# Patient Record
Sex: Female | Born: 1955 | Race: White | Hispanic: No | Marital: Married | Smoking: Former smoker
Health system: Southern US, Community
[De-identification: ages and names within clinical notes are randomized; demographics above are authoritative.]

## PROBLEM LIST (undated history)

## (undated) HISTORY — PX: NECK SURGERY: SHX720

---

## 2011-04-16 ENCOUNTER — Ambulatory Visit: Payer: Self-pay

## 2013-12-20 ENCOUNTER — Emergency Department: Payer: Self-pay | Admitting: Emergency Medicine

## 2014-05-12 ENCOUNTER — Emergency Department: Payer: Self-pay | Admitting: Student
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
## 2018-07-16 LAB — GENERIC EXTERNAL RESULT (UNMAPPED): Hemoglobin A1C: 6 % — ABNORMAL HIGH (ref 4.0–5.6)

## 2018-10-27 ENCOUNTER — Encounter: Payer: Self-pay | Admitting: Emergency Medicine

## 2018-10-27 ENCOUNTER — Other Ambulatory Visit: Payer: Self-pay

## 2018-10-27 ENCOUNTER — Ambulatory Visit
Admission: EM | Admit: 2018-10-27 | Discharge: 2018-10-27 | Disposition: A | Discharge status: Home / Self Care | Payer: Managed Care, Other (non HMO) | Attending: Urgent Care | Admitting: Urgent Care

## 2018-10-27 DIAGNOSIS — M25552 Pain in left hip: Secondary | ICD-10-CM | POA: Diagnosis not present

## 2018-10-27 DIAGNOSIS — M5432 Sciatica, left side: Secondary | ICD-10-CM

## 2018-10-27 MED ORDER — METHYLPREDNISOLONE 4 MG PO TBPK: ORAL_TABLET | ORAL | 0 refills | Status: AC

## 2018-10-27 MED ORDER — IBUPROFEN 800 MG PO TABS: 800.0000 mg | ORAL_TABLET | Freq: Three times a day (TID) | ORAL | 0 refills | Status: AC

## 2018-10-27 NOTE — Discharge Instructions (Addendum)
It was very nice meeting you today in clinic. Thank you for entrusting me with your care.   Please utilize the medications that we discussed. Your prescriptions have been called in to your pharmacy. Moist heat will help with your pain as well. Rest to the extent possible and allow the medications to take effect.   Make arrangements to follow up with your regular doctor in 1 week for re-evaluation. If your symptoms/condition worsens, please seek follow up care either here or in the ER. Please remember, our [facility] providers are "right here with you" when you need [place].   Again, it was my pleasure to take care of you today. Thank you for choosing our clinic. I hope that you start to feel better quickly.   Quentin Mulling, MSN, APRN, FNP-C, CEN Advanced Practice Provider [facility]

## 2018-10-27 NOTE — ED Provider Notes (Signed)
[address] [place], [place] [address] [phone]   Name: Valerie Gonzalez DOB: [birth year] MRN: [id number] CSN: [id number] PCP: Patient, No Pcp Per  Arrival date and time:  10/27/18 0802  Chief Complaint:  Hip Pain (left)  NOTE: Prior to seeing the patient today, I have reviewed the triage nursing documentation and vital signs. Clinical staff has updated patient's PMH/PSHx, current medication list, and drug allergies/intolerances to ensure comprehensive history available to assist in medical decision making.   History:   HPI: Valerie Gonzalez is a 63 y.o. female who presents today with complaints of LEFT hip pain for the last 3 to 3-1/2 weeks.  Pain precipitated by increased bending at work.  PMH (+) for sciatica, however patient has not had issues in quite some time.  She advises that pain radiates from her hip down into her foot.  Patient denies any extremity weakness or issues with bowel or bladder incontinence.  Patient denies past medical history significant for disc problems in her lower back.  Patient has been using a heating pad and ice, which has been minimally effective.  Additionally, patient using ibuprofen to help with her pain several times a day.  Pain affecting her sleep quality; patient having to sleep in a recliner at night.  History reviewed. No pertinent past medical history.  Past Surgical History:  Procedure Laterality Date  . NECK SURGERY      Family History  Problem Relation Age of Onset  . Cancer Father     Social History   Socioeconomic History  . Marital status: Married    Spouse name: Not on file  . Number of children: Not on file  . Years of education: Not on file  . Highest education level: Not on file  Occupational History  . Not on file  Social Needs  . Financial resource strain: Not on file  . Food insecurity:    Worry: Not on file    Inability: Not on file  . Transportation needs:    Medical: Not on file    Non-medical:  Not on file  Tobacco Use  . Smoking status: Former Games developer  . Smokeless tobacco: Never Used  Substance and Sexual Activity  . Alcohol use: Not Currently  . Drug use: Never  . Sexual activity: Not on file  Lifestyle  . Physical activity:    Days per week: Not on file    Minutes per session: Not on file  . Stress: Not on file  Relationships  . Social connections:    Talks on phone: Not on file    Gets together: Not on file    Attends religious service: Not on file    Active member of club or organization: Not on file    Attends meetings of clubs or organizations: Not on file    Relationship status: Not on file  . Intimate partner violence:    Fear of current or ex partner: Not on file    Emotionally abused: Not on file    Physically abused: Not on file    Forced sexual activity: Not on file  Other Topics Concern  . Not on file  Social History Narrative  . Not on file    There are no active problems to display for this patient.   Home Medications:    No outpatient medications have been marked as taking for the 10/27/18 encounter [facility] Encounter).    Allergies:   Patient has no known allergies.  Review of Systems (  ROS): Review of Systems  Constitutional: Negative for chills and fever.  Respiratory: Negative for cough and shortness of breath.   Cardiovascular: Negative for chest pain and palpitations.  Musculoskeletal: Positive for back pain.  Neurological: Positive for numbness. Negative for weakness.     Physical Exam:  Triage Vital Signs ED Triage Vitals  Enc Vitals Group     BP 10/27/18 0818 133/90     Pulse Rate 10/27/18 0818 73     Resp 10/27/18 0818 14     Temp 10/27/18 0818 97.7 F (36.5 C)     Temp Source 10/27/18 0818 Oral     SpO2 10/27/18 0818 100 %     Weight 10/27/18 0813 140 lb (63.5 kg)     Height 10/27/18 0813 5' (1.524 m)     Head Circumference --      Peak Flow --      Pain Score 10/27/18 0813 2     Pain Loc --      Pain Edu? --       Excl. in GC? --      Physical Exam  Constitutional: She is oriented to person, place, and time and well-developed, well-nourished, and in no distress.  HENT:  Head: Normocephalic and atraumatic.  Mouth/Throat: Mucous membranes are normal.  Eyes: Pupils are equal, round, and reactive to light. EOM are normal.  Neck: Neck supple.  Cardiovascular: Normal rate, regular rhythm, normal heart sounds and intact distal pulses. Exam reveals no gallop and no friction rub.  No murmur heard. Pulmonary/Chest: Effort normal and breath sounds normal. No respiratory distress. She has no wheezes. She has no rales.  Musculoskeletal:     Left hip: She exhibits tenderness. She exhibits no swelling and no deformity.     Comments: Radicular pain extending from LEFT lateral hip down the posterior aspect of her lower extremity into her foot. (+) numbness and tingling. PMS intact.   Neurological: She is alert and oriented to person, place, and time.  Skin: Skin is warm and dry. No rash noted. No erythema.  Psychiatric: Mood, affect and judgment normal.  Nursing note and vitals reviewed.   Urgent Care Treatments / Results:   LABS: PLEASE NOTE: all labs that were ordered this encounter are listed, however only abnormal results are displayed. Labs Reviewed - No data to display  EKG: -None  RADIOLOGY: No results found.  PRODEDURES: Procedures  MEDICATIONS RECEIVED THIS VISIT: Medications - No data to display  PERTINENT CLINICAL COURSE NOTES/UPDATES:   Initial Impression / Assessment and Plan / Urgent Care Course:    Valerie Gonzalez is a 63 y.o. female who [facility] to [facility] today with complaints of Hip Pain (left)  Pertinent labs & imaging results that were available during my care of the patient were personally reviewed by me and considered in my medical decision making (see lab/imaging section of note for values and interpretations).  Exam reveals minimal tenderness over LEFT hip.   Patient complains of numbness and tingling extending from her hip down into her foot.  PMH (+) for sciatica.  Discussed that presenting symptoms consistent with radiculopathy related to her sciatic nerve.  Will treat with anti-inflammatory (ibuprofen) and systemic steroids (Medrol).  Patient encouraged to utilize moist heat to help with her pain.  Additionally, she was encouraged to rest to allow medications time to be effective.  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  Patient advised to return call to the clinic should she need documentation to be out of work for worsening symptoms.  Discussed follow up with primary care physician in 1 week for re-evaluation. I have reviewed the follow up and strict return precautions for any new or worsening symptoms. Patient is aware of symptoms that would be deemed urgent/emergent, and would thus require further evaluation either here or in the emergency department. At the time of discharge, she verbalized understanding and consent with the discharge plan as it was reviewed with her. All questions were fielded by provider and/or clinic staff prior to patient discharge.    Final Clinical Impressions(s) / Urgent Care Diagnoses:   Final diagnoses:  Sciatica of left side without back pain  Pain of left hip joint    New Prescriptions:   Meds ordered this encounter  Medications  . ibuprofen (ADVIL) 800 MG tablet    Sig: Take 1 tablet (800 mg total) by mouth 3 (three) times daily.    Dispense:  21 tablet    Refill:  0  . methylPREDNISolone (MEDROL DOSEPAK) 4 MG TBPK tablet    Sig: Taper per package instructions.    Dispense:  21 tablet    Refill:  0    Controlled Substance Prescriptions:  [place] Controlled Substance Registry consulted? Not Applicable  NOTE: This note was prepared using Dragon dictation software along with smaller  phrase technology. Despite my best ability to proofread, there is the potential that transcriptional errors may still occur from this process, and are completely unintentional.     Verlee Monte, NP 10/27/18 [phone]

## 2018-10-27 NOTE — ED Triage Notes (Signed)
Patient c/o left hip pain that started about 3 weeks ago.  Patient also reports that her pain goes down her left leg.

## 2018-11-02 ENCOUNTER — Other Ambulatory Visit: Payer: Self-pay

## 2018-11-02 ENCOUNTER — Ambulatory Visit
Admission: EM | Admit: 2018-11-02 | Discharge: 2018-11-02 | Disposition: A | Discharge status: Home / Self Care | Payer: Managed Care, Other (non HMO) | Attending: Family Medicine | Admitting: Family Medicine

## 2018-11-02 ENCOUNTER — Encounter: Payer: Self-pay | Admitting: Emergency Medicine

## 2018-11-02 ENCOUNTER — Ambulatory Visit (INDEPENDENT_AMBULATORY_CARE_PROVIDER_SITE_OTHER): Payer: Managed Care, Other (non HMO)

## 2018-11-02 DIAGNOSIS — M25552 Pain in left hip: Secondary | ICD-10-CM | POA: Diagnosis not present

## 2018-11-02 DIAGNOSIS — M25559 Pain in unspecified hip: Secondary | ICD-10-CM

## 2018-11-02 DIAGNOSIS — M5432 Sciatica, left side: Secondary | ICD-10-CM | POA: Diagnosis not present

## 2018-11-02 MED ORDER — CYCLOBENZAPRINE HCL 10 MG PO TABS: 10.0000 mg | ORAL_TABLET | Freq: Every day | ORAL | 0 refills | Status: AC

## 2018-11-02 MED ORDER — PREDNISONE 20 MG PO TABS: ORAL_TABLET | ORAL | 0 refills | Status: AC

## 2018-11-02 MED ORDER — HYDROCODONE-ACETAMINOPHEN 5-325 MG PO TABS: ORAL_TABLET | ORAL | 0 refills | Status: AC

## 2018-11-02 NOTE — ED Triage Notes (Signed)
Patient was seen here on 6/2 for left hip pain.  Patient c/o ongoing pain in her left hip and lower back.  Patient states that her medicines have not helped.

## 2018-11-02 NOTE — ED Provider Notes (Signed)
[facility]    CSN: [id number] Arrival date & time: 11/02/18  1141     History   Chief Complaint Chief Complaint  Patient presents with   Back Pain    HPI Valerie Gonzalez is a 63 y.o. female.   63 yo female with approximately 2 weeks h/o left hip pain as well as low back pain radiating down the back of her leg. Denies any falls or injuries, however states has had hip problems in the past. Was seen here last week and given medrol dose pak which patient states only helped minimally. States pain is worse when walking up stairs. Denies any fevers, chills.    Back Pain    History reviewed. No pertinent past medical history.  There are no active problems to display for this patient.   Past Surgical History:  Procedure Laterality Date   NECK SURGERY      OB History   No obstetric history on file.      Home Medications    Prior to Admission medications   Medication Sig Start Date End Date Taking? Authorizing Provider  ibuprofen (ADVIL) 800 MG tablet Take 1 tablet (800 mg total) by mouth 3 (three) times daily. 10/27/18  Yes Verlee MonteGray, Bryan E, NP  cyclobenzaprine (FLEXERIL) 10 MG tablet Take 1 tablet (10 mg total) by mouth at bedtime. 11/02/18   Payton Mccallumonty, Zamir Staples, MD  HYDROcodone-acetaminophen (NORCO/VICODIN) 5-325 MG tablet 1-2 tabs po q 8 hours prn 11/02/18   Payton Mccallumonty, Meighan Treto, MD  methylPREDNISolone (MEDROL DOSEPAK) 4 MG TBPK tablet Taper per package instructions. 10/27/18   Verlee MonteGray, Bryan E, NP  predniSONE (DELTASONE) 20 MG tablet 3 tabs po once day 1, then 2 tabs po qd x 3 days, then 1 tab po qd x 3 days, then half a tab po qd x 3 days 11/02/18   Payton Mccallumonty, Quinita Kostelecky, MD    Family History Family History  Problem Relation Age of Onset   Cancer Father     Social History Social History   Tobacco Use   Smoking status: Former Smoker   Smokeless tobacco: Never Used  Substance Use Topics   Alcohol use: Not Currently   Drug use: Never     Allergies   Patient has no  known allergies.   Review of Systems Review of Systems  Musculoskeletal: Positive for back pain.     Physical Exam Triage Vital Signs ED Triage Vitals  Enc Vitals Group     BP 11/02/18 1153 113/71     Pulse Rate 11/02/18 1153 79     Resp 11/02/18 1153 16     Temp 11/02/18 1153 98 F (36.7 C)     Temp Source 11/02/18 1153 Oral     SpO2 11/02/18 1153 98 %     Weight 11/02/18 1151 140 lb (63.5 kg)     Height 11/02/18 1151 5' (1.524 m)     Head Circumference --      Peak Flow --      Pain Score 11/02/18 1151 10     Pain Loc --      Pain Edu? --      Excl. in GC? --    No data found.  Updated Vital Signs BP 113/71 (BP Location: Left Arm)    Pulse 79    Temp 98 F (36.7 C) (Oral)    Resp 16    Ht 5' (1.524 m)    Wt 63.5 kg    SpO2 98%    BMI  27.34 kg/m   Visual Acuity Right Eye Distance:   Left Eye Distance:   Bilateral Distance:    Right Eye Near:   Left Eye Near:    Bilateral Near:     Physical Exam Vitals signs and nursing note reviewed.  Constitutional:      General: She is not in acute distress.    Appearance: She is well-developed. She is not diaphoretic.  Musculoskeletal:        General: Tenderness present.     Left hip: She exhibits tenderness (lateral) and bony tenderness. She exhibits normal range of motion, normal strength, no swelling, no crepitus, no deformity and no laceration.     Lumbar back: She exhibits tenderness (over the left buttock along the sciatic nerve) and spasm. She exhibits normal range of motion, no bony tenderness, no swelling, no edema, no deformity, no laceration, no pain and normal pulse.  Skin:    General: Skin is warm and dry.     Findings: No erythema or rash.  Neurological:     General: No focal deficit present.     Mental Status: She is alert.     Motor: No abnormal muscle tone.     Deep Tendon Reflexes: Reflexes are normal and symmetric. Reflexes normal.      UC Treatments / Results  Labs (all labs ordered are  listed, but only abnormal results are displayed) Labs Reviewed - No data to display  EKG None  Radiology Dg Hip Unilat With Pelvis 2-3 Views Left  Result Date: 11/02/2018 CLINICAL DATA:  Hip pain for 15 years EXAM: DG HIP (WITH OR WITHOUT PELVIS) 2-3V LEFT COMPARISON:  None. FINDINGS: There is no evidence of hip fracture or dislocation. There is no evidence of arthropathy or other focal bone abnormality. IMPRESSION: No acute osseous injury of the left hip. Electronically Signed   By: Kathreen Devoid   On: 11/02/2018 12:43    Procedures Procedures (including critical care time)  Medications Ordered in UC Medications - No data to display  Initial Impression / Assessment and Plan / UC Course  I have reviewed the triage vital signs and the nursing notes.  Pertinent labs & imaging results that were available during my care of the patient were reviewed by me and considered in my medical decision making (see chart for details).      Final Clinical Impressions(s) / UC Diagnoses   Final diagnoses:  Hip pain  Sciatica of left side    ED Prescriptions    Medication Sig Dispense Auth. Provider   predniSONE (DELTASONE) 20 MG tablet 3 tabs po once day 1, then 2 tabs po qd x 3 days, then 1 tab po qd x 3 days, then half a tab po qd x 3 days 14 tablet Lamyra Malcolm, MD   cyclobenzaprine (FLEXERIL) 10 MG tablet Take 1 tablet (10 mg total) by mouth at bedtime. 30 tablet Norval Gable, MD   HYDROcodone-acetaminophen (NORCO/VICODIN) 5-325 MG tablet 1-2 tabs po q 8 hours prn 6 tablet [place] Festa, MD      1. x-ray results and diagnosis reviewed with patient 2. rx as per orders above; reviewed possible side effects, interactions, risks and benefits  3. Recommend supportive treatment with rest, ice/heat 4. Follow-up prn if symptoms worsen or don't improve   Controlled Substance Prescriptions [place] Controlled Substance Registry consulted? Not Applicable   Norval Gable, MD 11/02/18 1308

## 2019-03-31 LAB — GENERIC EXTERNAL RESULT (UNMAPPED): Hemoglobin A1C: 5.6 % (ref 4.0–5.6)

## 2019-07-20 LAB — GENERIC EXTERNAL RESULT (UNMAPPED): Hemoglobin A1C: 5.6 % (ref 4.0–5.6)

## 2021-01-29 IMAGING — CR DG HIP (WITH OR WITHOUT PELVIS) 2-3V LEFT
3 series · 3 of 3 positions shown · non-contrast
Comparison: None.

CLINICAL DATA: Hip pain for 15 years

EXAM:
DG HIP (WITH OR WITHOUT PELVIS) 2-3V LEFT

[pelvis ap]
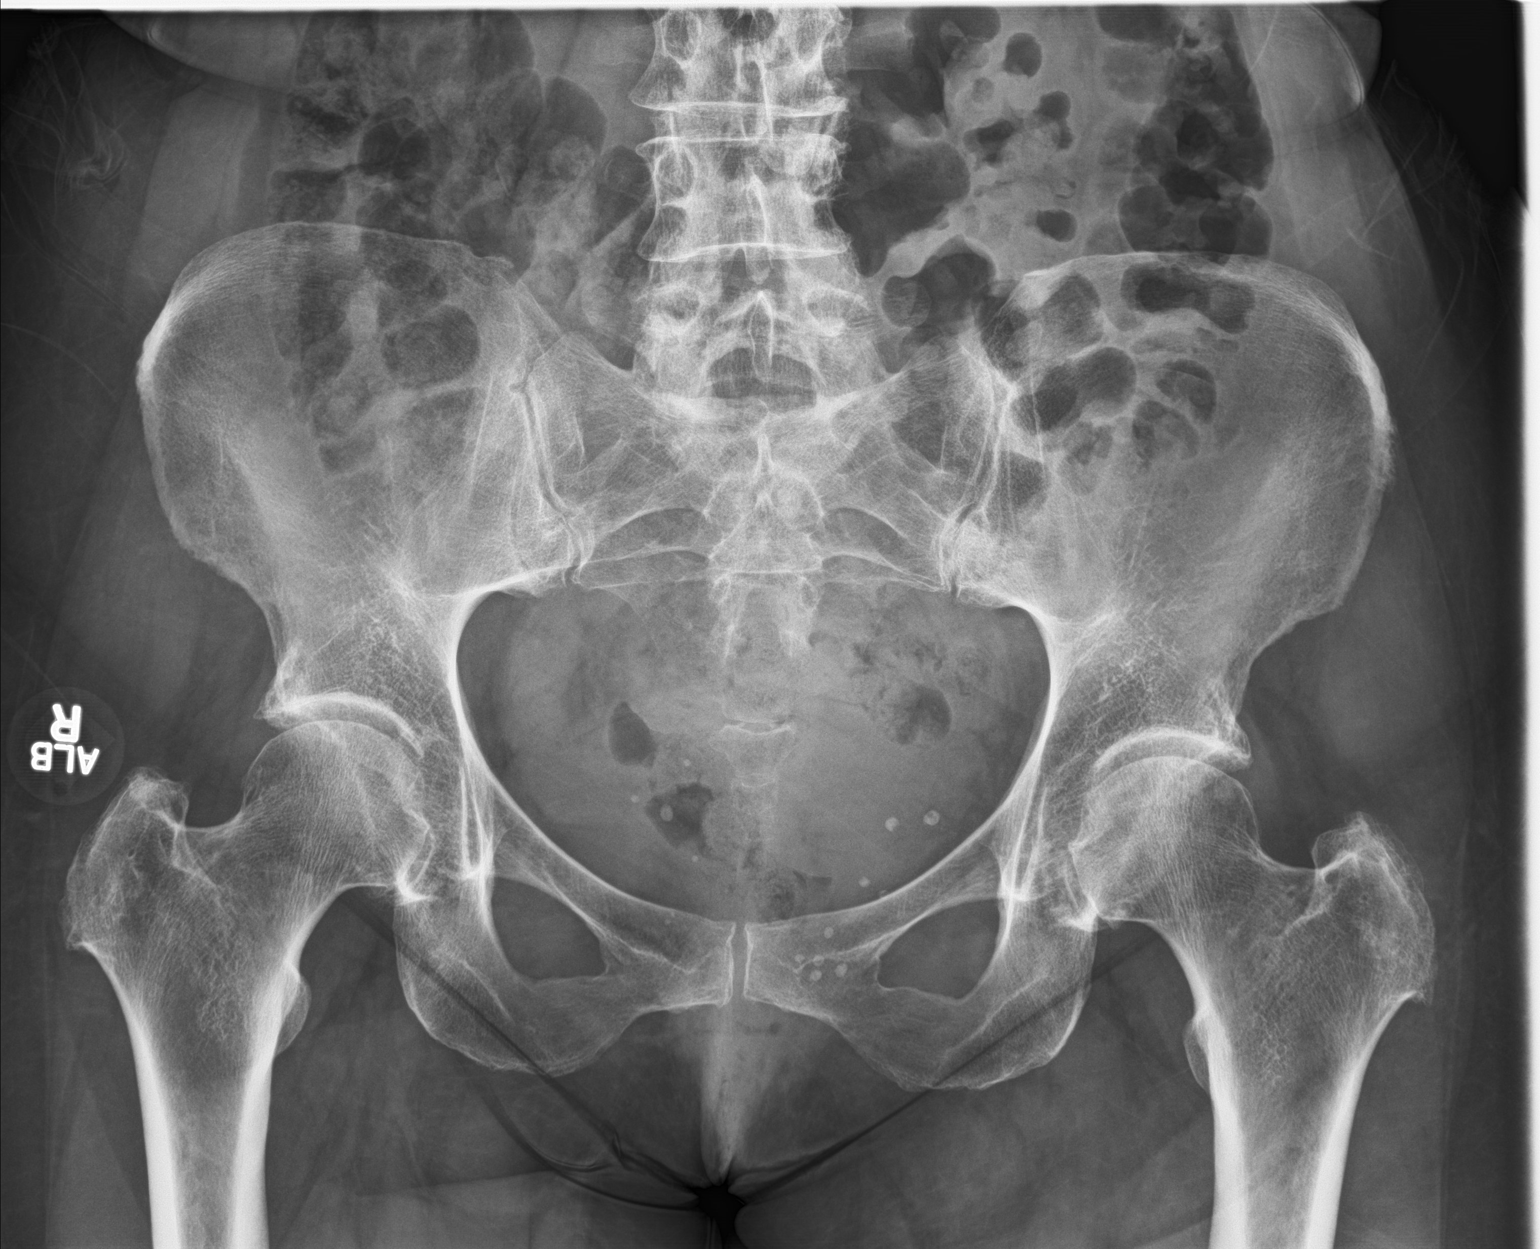

[hip ap]
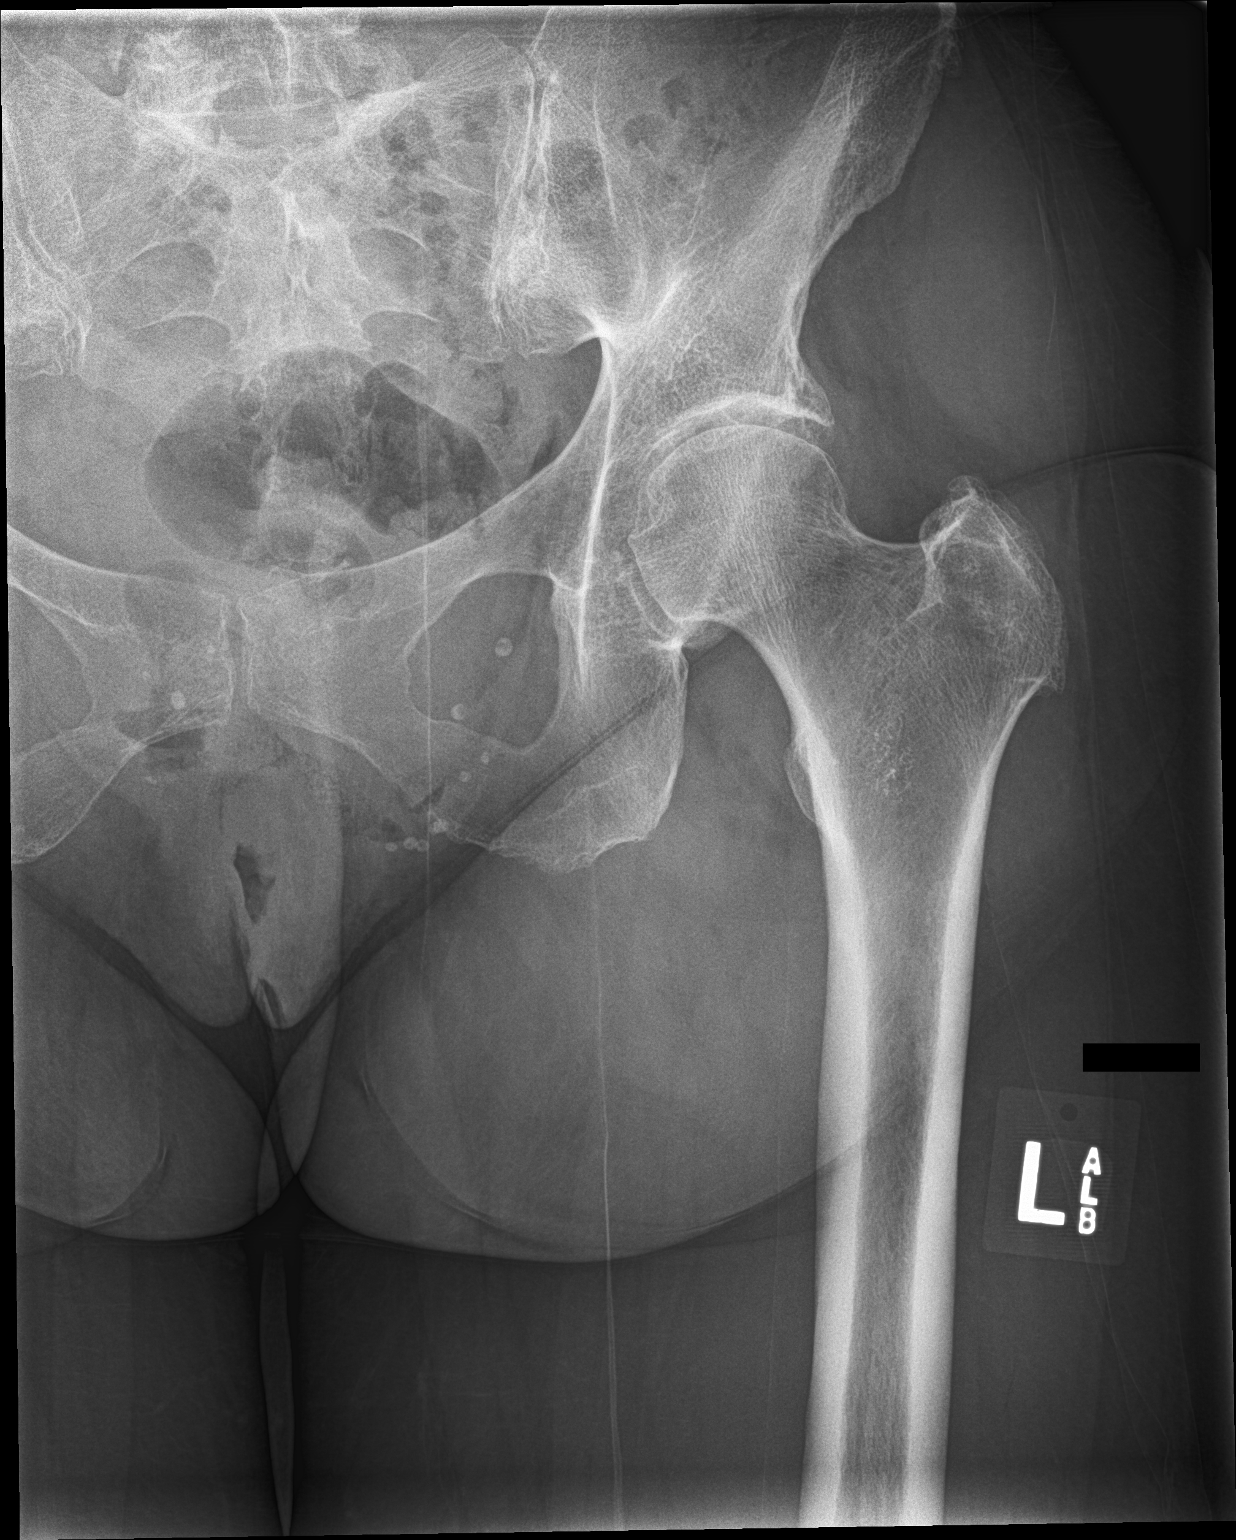

[hip lat]
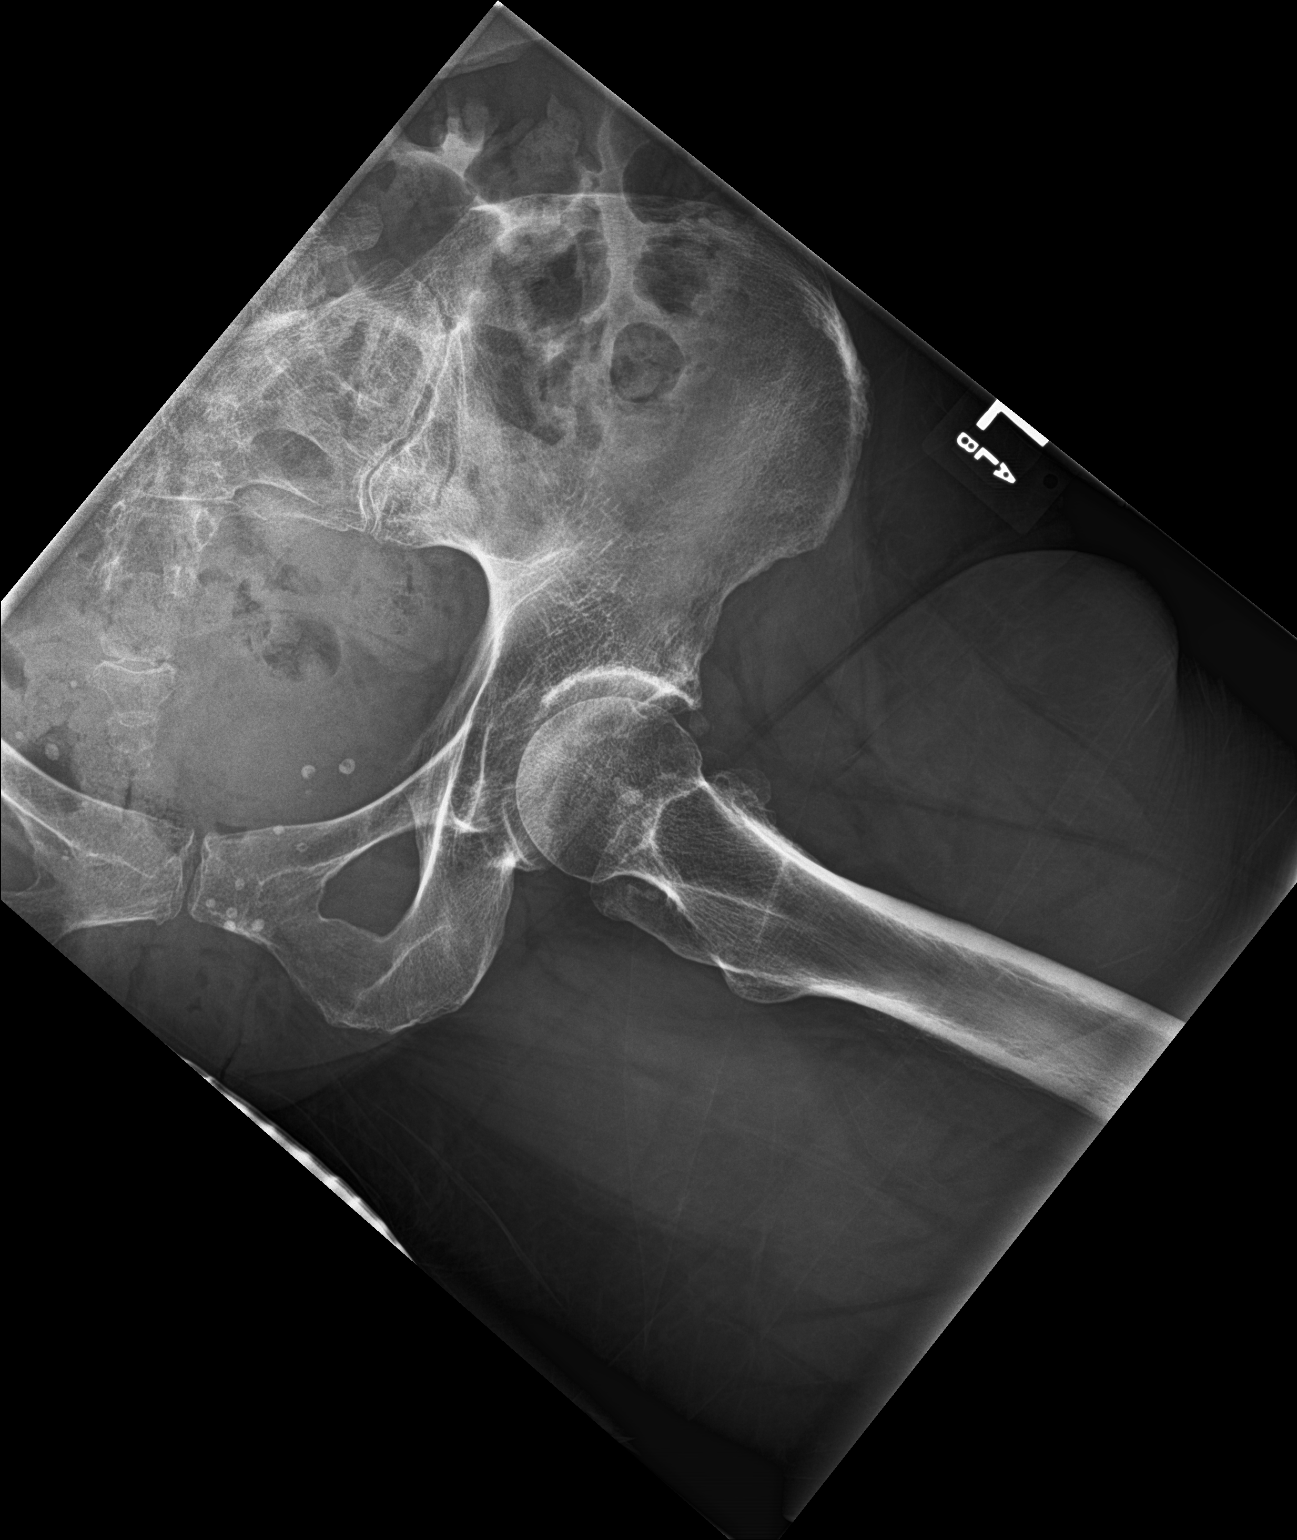

[3 of 3 positions shown; findings below may reference images not displayed]

FINDINGS: There is no evidence of hip fracture or dislocation. There is no
evidence of arthropathy or other focal bone abnormality.
IMPRESSION: No acute osseous injury of the left hip.

## 2021-03-19 ENCOUNTER — Encounter (INDEPENDENT_AMBULATORY_CARE_PROVIDER_SITE_OTHER): Payer: Self-pay | Admitting: Internal Medicine

## 2021-03-19 ENCOUNTER — Emergency Department
Admission: EM | Admit: 2021-03-19 | Discharge: 2021-03-19 | Disposition: A | Discharge status: Home / Self Care | Payer: TRICARE Prime—HMO | Attending: Emergency Medicine | Admitting: Emergency Medicine

## 2021-03-19 DIAGNOSIS — I10 Essential (primary) hypertension: Secondary | ICD-10-CM | POA: Insufficient documentation

## 2021-03-19 DIAGNOSIS — Z85118 Personal history of other malignant neoplasm of bronchus and lung: Secondary | ICD-10-CM | POA: Insufficient documentation

## 2021-03-19 DIAGNOSIS — H43812 Vitreous degeneration, left eye: Secondary | ICD-10-CM | POA: Insufficient documentation

## 2021-03-19 DIAGNOSIS — R7303 Prediabetes: Secondary | ICD-10-CM | POA: Insufficient documentation

## 2021-03-19 MED ORDER — FLUORESCEIN SODIUM 1 MG OP STRP
1.0000 | ORAL_STRIP | Freq: Once | OPHTHALMIC | Status: DC
Start: 2021-03-19 — End: 2021-03-19
  Filled 2021-03-19: qty 1

## 2021-03-19 MED ORDER — PROPARACAINE HCL 0.5 % OP SOLN
1.0000 [drp] | Freq: Once | OPHTHALMIC | Status: AC
Start: 2021-03-19 — End: 2021-03-19
  Administered 2021-03-19 (×2): 1 [drp] via OPHTHALMIC
  Filled 2021-03-19: qty 300

## 2021-03-19 NOTE — Consults (Cosign Needed)
OPHTHALMOLOGY CONSULTATION    Consulted by: Alvan Dame, MD     HPI: Mary Burch is a 65 year old female for whom Ophthalmology has been consulted for evaluation of vitreous hemorrhage.     Pt reports onset of a "cobweb" over her right eye 1 week ago (Tuesday 10/18) when she was picking up her grandchild, feels it has since improved into a central ball of clouding with stranding. Hx of prediabetes and HTN reportedly well controlled. No eye pain, flashes of light, or vision loss. Not on blood thinners. No hx of ocular/intracranial trauma.    POH:  Ocular diagnoses: moderate myopia  FH: none  Surgeries: none  Visual aids: reading glasses    Ocular Meds: none    Allergies:  No Known Allergies    PMH/PSH: per primary team  Meds: per primary team  FH/SH: per primary team    Objective:  Near VA cc     OD 20/20 -1     OS 20/20 -1  Color plates 8/8 OU   Pupils RR OU, no RAPD  EOMI OU  Confrontational VF FTFC OU  Tp 13 OD, 15 OS     SLE  External: wnl OU  Lids/Lashes: wnl OU  Conjunctiva/Sclera: w/q OU  Cornea: clr OU  Anterior Chamber: formed OU  Iris: rr OU  Lens: clr OU    360 SDFE:  OD: PVD inf, disc sharp, macula flat, vessels/periphery wnl, no heme no RD/RT  OS: disc sharp, macula flat, vessels/periphery wnl no RD/RT    B-scan image in media reviewed, appears to be a PVD    A/P:  #Posterior vitreous detachment, right eye  -No evidence of hemorrhage on fundoscopic exam  -Risk factors include: myopia  -No RD, No RT on exam. WS reviewed including: sudden onset shower of flashes/floaters, visual field loss, sustained visual acuity decline.  -Patient to follow-up at [facility] at [address] [place], [place] [address] with Retina fellow on 10/31. Call [phone] to make an appointment at the suggested time frame.    Thank you for the interesting consult. Please page if any questions or concerns.    Camille Bal, MD  Ophthalmology PGY-2  [facility]    Attending of record, Dr.  Darleen Crocker, MD Retina fellow

## 2021-03-19 NOTE — Discharge Instructions (Signed)
SEEK MEDICAL ATTENTION IMMEDIATELY, EITHER HERE OR AT THE NEAREST EMERGENCY DEPARTMENT, IF ANY OF THE FOLLOWING HAPPENS:  You have a lot of eye pain.  You get more floaters and flashes in your vision.  You get blurred vision, loss of balance, or coordination.    You get discharge coming from the eye.  You have a fever (temperature higher than 100.33F or 38C). Your eye is red or swollen.     Please follow-up at [facility] at [address] [place], [place] [address] with Retina fellow on 10/31. Call [phone] to make an appointment at the suggested time frame.

## 2021-03-19 NOTE — ED Provider Notes (Signed)
ED Department Resident Note    Patient: Mary Burch, MRN [id number], DOB [birth year]  The Date of Service for the Emergency Room encounter is 03/19/2021  9:40 AM     Nursing Triage Note :   Chief Complaint   Patient presents with    Eye Problem     She states that she is seeing webbing the LEFT eye for the last week.        Patients [facility] EMR record and nursing triage note reviewed    HPI :   65 year old female with a PMH significant for prediabetes, lung cancer status post resection with completion of treatment who presents with 6 days of left eye visual field changes.  Reports he started seeing cobwebs in her left eye that have progressed with decreased vision over the past week.  No photophobia, painful eye movements.  No falls or trauma.  She is not on thinners.  Reports she wanted to come in sooner but her granddaughter was born 4 days ago and has been helping watch her    States visual field looks like this:        Past Medical History : No past medical history on file.    Past Surgical history : No past surgical history on file.    Family History: No family history on file.    Social History:  Social History     Socioeconomic History    Marital status: Widowed     Alcohol/Tobacco/Illicit drugs: denies    Medications:   None       Allergies: Patient has no known allergies.    Review of Systems : All other systems reviewed and negative unless otherwise noted in the HPI or above. This was done per my custom and practice for systems appropriate to the chief complaint in an emergency department setting and varies depending on the quality of history that the patient is able to provide.    Physical Exam   03/19/21  0938 03/19/21  1535   BP: 134/70 124/77   Pulse: 82 71   Resp: 16 15   Temp: 97.9 F (36.6 C)    SpO2: 99% 100%       Physical Exam   Constitutional:  oriented to person, place, and time and well-developed, well-nourished, and in no distress. No distress.   HENT:   Head: Normocephalic and  atraumatic.   Eyes:    B/l periorbital area unremarkable, pupils reactive to light 4-2, minimal scleral injection, no conjunctival discharge.     Neck: No tracheal deviation present.   Cardiovascular: Normal rate and regular rhythm.   Pulmonary/Chest: Effort normal and breath sounds normal. No stridor.   Abdominal: Soft. nontender  Neurological: Alert and oriented to person, place, and time. GCS score is 15.   Skin: Skin is warm and dry.   Psychiatric: Affect and judgment normal.   Nursing note and vitals reviewed.    Physical Exam      Impression and Initial ED Plan  65 year old  female with PMHx as listed in HPI presents with left eye visual field deficits.  Ultrasound is suggestive of vitreous detachment without hemorrhage which is consistent with her presentation however differential for painless vision loss includes amarosis fugax, CRAO (neuroimaging not ordered, stroke code not called d/t 6 day duration of symptoms high suspicion for PVD), retinal detachment. Ophthalmology consulted, visual acuity as below.    Medications   proparacaine (ALCAINE) 0.5 % ophthalmic solution 1 drop (1  drop Both Eyes Given by Other 03/19/21 1357)     ED Course as of 03/20/21 0835   Berneice Gandy Deborah's Documentation   Mon Mar 19, 2021   1434 Posterior vitreous detachment without hemorrhage per ophtho, follow up at [facility] next week   1116 Ophtho to see   1043  Visual Acuity  Visual Acuity Assess/Not Asses Visual Acuity  Visual Acuity Assess/Not Assessed:Assessed Right Eye (OD) - Uncorrected:20/40 Left Eye (OS) - Uncorrected:20/25 Right (OD) - Pin hole/Corrected:20/25 Left Eye (OS) - Pin hole/Corrected:20/20    OZD:[id number] Right Eye (OD) - Uncorrected:20/40 Left Eye (OS) - Uncorrected:20/25 Right (OD) - Pin hole/Corrected:20/25 Left Eye (OS) - Pin hole/Corrected:20/20         Patient was observed here in ED and reassessed multiple times and had questions answered. No new complaints at this time. I believe that at this  time patient suitable for discharge with outpatient follow up with ophthalmology. Spoke with patient about the limitations of our testing and the possibility of need for return if symptoms worsen. Patient was given return precautions. Patient verbalizes understanding and agrees with plan. No questions at this time. Discharged in good condition.       ICD-10-CM ICD-9-CM    1. Vitreous detachment of left eye  H43.812 379.21         I have discussed my thought process and plan for the patient with the attending physician, Dr Kendra Opitz, Velora Heckler, MD  Resident  03/20/21 4259       Marissa Calamity, MD  03/20/21 [phone]

## 2021-03-23 NOTE — Progress Notes (Deleted)
OCULAR HISTORY    Referred for PVD OD, seen by resident Dr. Camille Bal 03/19/21    IMAGING/TESTING    Fundus Photo Interpretation    OD:     OS:       OCT Interpretation:    OD:    OS:    ASSESSMENT/PLAN          No follow-ups on file.    Deetta Perla MD, PhD

## 2021-03-26 ENCOUNTER — Ambulatory Visit (INDEPENDENT_AMBULATORY_CARE_PROVIDER_SITE_OTHER): Payer: TRICARE Prime—HMO

## 2023-07-07 DIAGNOSIS — H2513 Age-related nuclear cataract, bilateral: Secondary | ICD-10-CM | POA: Diagnosis not present

## 2023-07-07 DIAGNOSIS — H33191 Other retinoschisis and retinal cysts, right eye: Secondary | ICD-10-CM | POA: Diagnosis not present

## 2023-07-07 DIAGNOSIS — G453 Amaurosis fugax: Secondary | ICD-10-CM | POA: Diagnosis not present

## 2023-07-07 DIAGNOSIS — H40019 Open angle with borderline findings, low risk, unspecified eye: Secondary | ICD-10-CM | POA: Diagnosis not present

## 2023-07-09 ENCOUNTER — Other Ambulatory Visit (INDEPENDENT_AMBULATORY_CARE_PROVIDER_SITE_OTHER): Payer: Self-pay | Admitting: Optometry

## 2023-07-09 ENCOUNTER — Ambulatory Visit (INDEPENDENT_AMBULATORY_CARE_PROVIDER_SITE_OTHER): Payer: Medicare Other

## 2023-07-09 DIAGNOSIS — G453 Amaurosis fugax: Secondary | ICD-10-CM | POA: Diagnosis not present

## 2023-10-06 DIAGNOSIS — G453 Amaurosis fugax: Secondary | ICD-10-CM | POA: Diagnosis not present

## 2023-10-14 ENCOUNTER — Encounter (INDEPENDENT_AMBULATORY_CARE_PROVIDER_SITE_OTHER): Payer: Self-pay

## 2024-04-08 ENCOUNTER — Telehealth: Payer: Self-pay

## 2024-04-08 NOTE — Telephone Encounter (Signed)
 Attempted to reach patient on 04/06/24 and on 04/08/24- left vm
